# Patient Record
Sex: Male | Born: 1975 | Race: White | Hispanic: No
Health system: Southern US, Community
[De-identification: ages and names within clinical notes are randomized; demographics above are authoritative.]

## PROBLEM LIST (undated history)

## (undated) DIAGNOSIS — J45909 Unspecified asthma, uncomplicated: Secondary | ICD-10-CM

## (undated) DIAGNOSIS — R569 Unspecified convulsions: Secondary | ICD-10-CM

## (undated) DIAGNOSIS — I10 Essential (primary) hypertension: Secondary | ICD-10-CM

## (undated) DIAGNOSIS — F909 Attention-deficit hyperactivity disorder, unspecified type: Secondary | ICD-10-CM

## (undated) HISTORY — DX: Unspecified asthma, uncomplicated: J45.909

## (undated) HISTORY — DX: Unspecified convulsions: R56.9

## (undated) HISTORY — DX: Attention-deficit hyperactivity disorder, unspecified type: F90.9

## (undated) HISTORY — DX: Essential (primary) hypertension: I10

---

## 2019-10-24 DIAGNOSIS — G40409 Other generalized epilepsy and epileptic syndromes, not intractable, without status epilepticus: Secondary | ICD-10-CM | POA: Diagnosis not present

## 2019-10-24 DIAGNOSIS — F901 Attention-deficit hyperactivity disorder, predominantly hyperactive type: Secondary | ICD-10-CM | POA: Diagnosis not present

## 2019-10-24 DIAGNOSIS — M79671 Pain in right foot: Secondary | ICD-10-CM | POA: Diagnosis not present

## 2019-10-24 DIAGNOSIS — I10 Essential (primary) hypertension: Secondary | ICD-10-CM | POA: Diagnosis not present

## 2019-10-31 ENCOUNTER — Ambulatory Visit (INDEPENDENT_AMBULATORY_CARE_PROVIDER_SITE_OTHER): Payer: BC Managed Care – PPO | Admitting: Podiatry

## 2019-10-31 ENCOUNTER — Encounter: Payer: Self-pay | Admitting: Podiatry

## 2019-10-31 ENCOUNTER — Other Ambulatory Visit: Payer: Self-pay

## 2019-10-31 ENCOUNTER — Other Ambulatory Visit: Payer: Self-pay | Admitting: Podiatry

## 2019-10-31 ENCOUNTER — Ambulatory Visit (INDEPENDENT_AMBULATORY_CARE_PROVIDER_SITE_OTHER): Payer: BC Managed Care – PPO

## 2019-10-31 VITALS — Temp 97.3°F

## 2019-10-31 DIAGNOSIS — M19071 Primary osteoarthritis, right ankle and foot: Secondary | ICD-10-CM

## 2019-10-31 DIAGNOSIS — M79671 Pain in right foot: Secondary | ICD-10-CM

## 2019-10-31 DIAGNOSIS — S92061S Displaced intraarticular fracture of right calcaneus, sequela: Secondary | ICD-10-CM

## 2019-10-31 DIAGNOSIS — M25571 Pain in right ankle and joints of right foot: Secondary | ICD-10-CM | POA: Diagnosis not present

## 2019-10-31 DIAGNOSIS — M25471 Effusion, right ankle: Secondary | ICD-10-CM | POA: Diagnosis not present
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
## 2019-11-06 ENCOUNTER — Telehealth: Payer: Self-pay | Admitting: Podiatry

## 2019-11-06 NOTE — Telephone Encounter (Signed)
Pt here on 10-31-19 for rt foot pain, had injection and was doing better. No new injury since visit.  Pt now having constant pain.  Has been using epson salt, heat, aleve, ibuprofen with no relief.  Pt would like a call back to discuss.

## 2019-11-07 ENCOUNTER — Telehealth: Payer: Self-pay | Admitting: *Deleted

## 2019-11-07 MED ORDER — METHYLPREDNISOLONE 4 MG PO TBPK: ORAL_TABLET | ORAL | 0 refills | Status: AC

## 2019-11-07 MED ORDER — TRAMADOL HCL 50 MG PO TABS: ORAL_TABLET | ORAL | 0 refills | Status: AC

## 2019-11-07 NOTE — Addendum Note (Signed)
Addended by: Alphia Kava D on: 11/07/2019 12:10 PM   Modules accepted: Orders

## 2019-11-07 NOTE — Telephone Encounter (Signed)
Left message on pt's phone with Dr. Kandice Hams orders.

## 2019-11-07 NOTE — Telephone Encounter (Addendum)
I spoke with Randall Pierce and alternating heating and ice without relief. Randall Pierce states may be irritation from the boot, but doesn't know what else to do for pt. Pt had to call out of work.

## 2019-11-07 NOTE — Telephone Encounter (Signed)
Dr. Samuella Cota ordered through [employer] medrol dose pack and tramadol 50mg  #6 one tablet every 8 hours. Unable to leave message informing of Dr. [place] orders for medrol dose pack and tramadol.

## 2019-11-07 NOTE — Telephone Encounter (Addendum)
Unable to leave message on Randall Pierce's phone for rx information, line was busy 2 times.

## 2019-11-07 NOTE — Telephone Encounter (Addendum)
Pt's wife, Randall Pierce states pt is in excruciating pain and the heating pad and warm espom salt soaks are not helping and he is having severe pain and irritation at the scar site, and the injection did not help.

## 2019-11-07 NOTE — Telephone Encounter (Signed)
Unable to leave a message on Randall Pierce's phone line is busy.

## 2019-11-07 NOTE — Telephone Encounter (Signed)
left message with Tramadol orders [id number].

## 2019-11-07 NOTE — Telephone Encounter (Signed)
Answered in 11/07/2019 Telephone Call.

## 2019-12-21 DIAGNOSIS — F17219 Nicotine dependence, cigarettes, with unspecified nicotine-induced disorders: Secondary | ICD-10-CM | POA: Diagnosis not present

## 2019-12-21 DIAGNOSIS — R1011 Right upper quadrant pain: Secondary | ICD-10-CM | POA: Diagnosis not present

## 2019-12-21 DIAGNOSIS — F901 Attention-deficit hyperactivity disorder, predominantly hyperactive type: Secondary | ICD-10-CM | POA: Diagnosis not present

## 2019-12-21 DIAGNOSIS — J309 Allergic rhinitis, unspecified: Secondary | ICD-10-CM | POA: Diagnosis not present

## 2020-03-02 ENCOUNTER — Encounter (HOSPITAL_COMMUNITY): Payer: Self-pay

## 2020-03-02 ENCOUNTER — Emergency Department (HOSPITAL_COMMUNITY): Payer: BC Managed Care – PPO

## 2020-03-02 ENCOUNTER — Emergency Department (HOSPITAL_COMMUNITY)
Admission: EM | Admit: 2020-03-02 | Discharge: 2020-03-03 | Disposition: A | Discharge status: Home / Self Care | Payer: BC Managed Care – PPO | Attending: Emergency Medicine | Admitting: Emergency Medicine

## 2020-03-02 DIAGNOSIS — Z20822 Contact with and (suspected) exposure to covid-19: Secondary | ICD-10-CM | POA: Diagnosis not present

## 2020-03-02 DIAGNOSIS — R0981 Nasal congestion: Secondary | ICD-10-CM | POA: Diagnosis not present

## 2020-03-02 DIAGNOSIS — R05 Cough: Secondary | ICD-10-CM | POA: Diagnosis not present

## 2020-03-02 DIAGNOSIS — R079 Chest pain, unspecified: Secondary | ICD-10-CM | POA: Diagnosis not present

## 2020-03-02 DIAGNOSIS — R509 Fever, unspecified: Secondary | ICD-10-CM | POA: Diagnosis not present

## 2020-03-02 DIAGNOSIS — Z5321 Procedure and treatment not carried out due to patient leaving prior to being seen by health care provider: Secondary | ICD-10-CM | POA: Insufficient documentation

## 2020-03-02 DIAGNOSIS — R0602 Shortness of breath: Secondary | ICD-10-CM | POA: Diagnosis not present

## 2020-03-02 LAB — CBC
HCT: 42.1 % (ref 39.0–52.0)
Hemoglobin: 13.6 g/dL (ref 13.0–17.0)
MCH: 31.1 pg (ref 26.0–34.0)
MCHC: 32.3 g/dL (ref 30.0–36.0)
MCV: 96.3 fL (ref 80.0–100.0)
Platelets: 245 10*3/uL (ref 150–400)
RBC: 4.37 MIL/uL (ref 4.22–5.81)
RDW: 13.4 % (ref 11.5–15.5)
WBC: 7.9 10*3/uL (ref 4.0–10.5)
nRBC: 0 % (ref 0.0–0.2)

## 2020-03-02 LAB — TROPONIN I (HIGH SENSITIVITY)
Troponin I (High Sensitivity): 6 ng/L (ref <18)
Troponin I (High Sensitivity): 8 ng/L (ref <18)

## 2020-03-02 LAB — BASIC METABOLIC PANEL
Anion gap: 11 (ref 5–15)
BUN: 9 mg/dL (ref 6–20)
CO2: 21 mmol/L — ABNORMAL LOW (ref 22–32)
Calcium: 8.5 mg/dL — ABNORMAL LOW (ref 8.9–10.3)
Chloride: 103 mmol/L (ref 98–111)
Creatinine, Ser: 1.05 mg/dL (ref 0.61–1.24)
GFR calc Af Amer: >60 mL/min (ref >60)
GFR calc non Af Amer: >60 mL/min (ref >60)
Glucose, Bld: 221 mg/dL — ABNORMAL HIGH (ref 70–99)
Potassium: 3.2 mmol/L — ABNORMAL LOW (ref 3.5–5.1)
Sodium: 135 mmol/L (ref 135–145)

## 2020-03-02 LAB — SARS CORONAVIRUS 2 BY RT PCR (HOSPITAL ORDER, PERFORMED IN ~~LOC~~ HOSPITAL LAB): SARS Coronavirus 2: NEGATIVE

## 2020-03-02 MED ORDER — SODIUM CHLORIDE 0.9% FLUSH: 3.0000 mL | Freq: Once | INTRAVENOUS | Status: DC

## 2020-03-02 NOTE — ED Notes (Signed)
Pt family member came up to this tech and asked how much longer tech told family member the wait time.

## 2020-03-02 NOTE — ED Triage Notes (Signed)
Pt states that he has been having has been having CP and SOB since Tuesday, went for Covid test on Wed that was negative then. Fever, cough, congestion

## 2020-03-03 NOTE — ED Notes (Signed)
Pt has left stickers on tech desk in lobby.

## 2020-12-01 IMAGING — DX DG CHEST 1V
1 series · 1 of 1 positions shown · non-contrast
Comparison: None.

CLINICAL DATA: Shortness of breath and chest pain.

EXAM:
CHEST  1 VIEW

[chest ap]
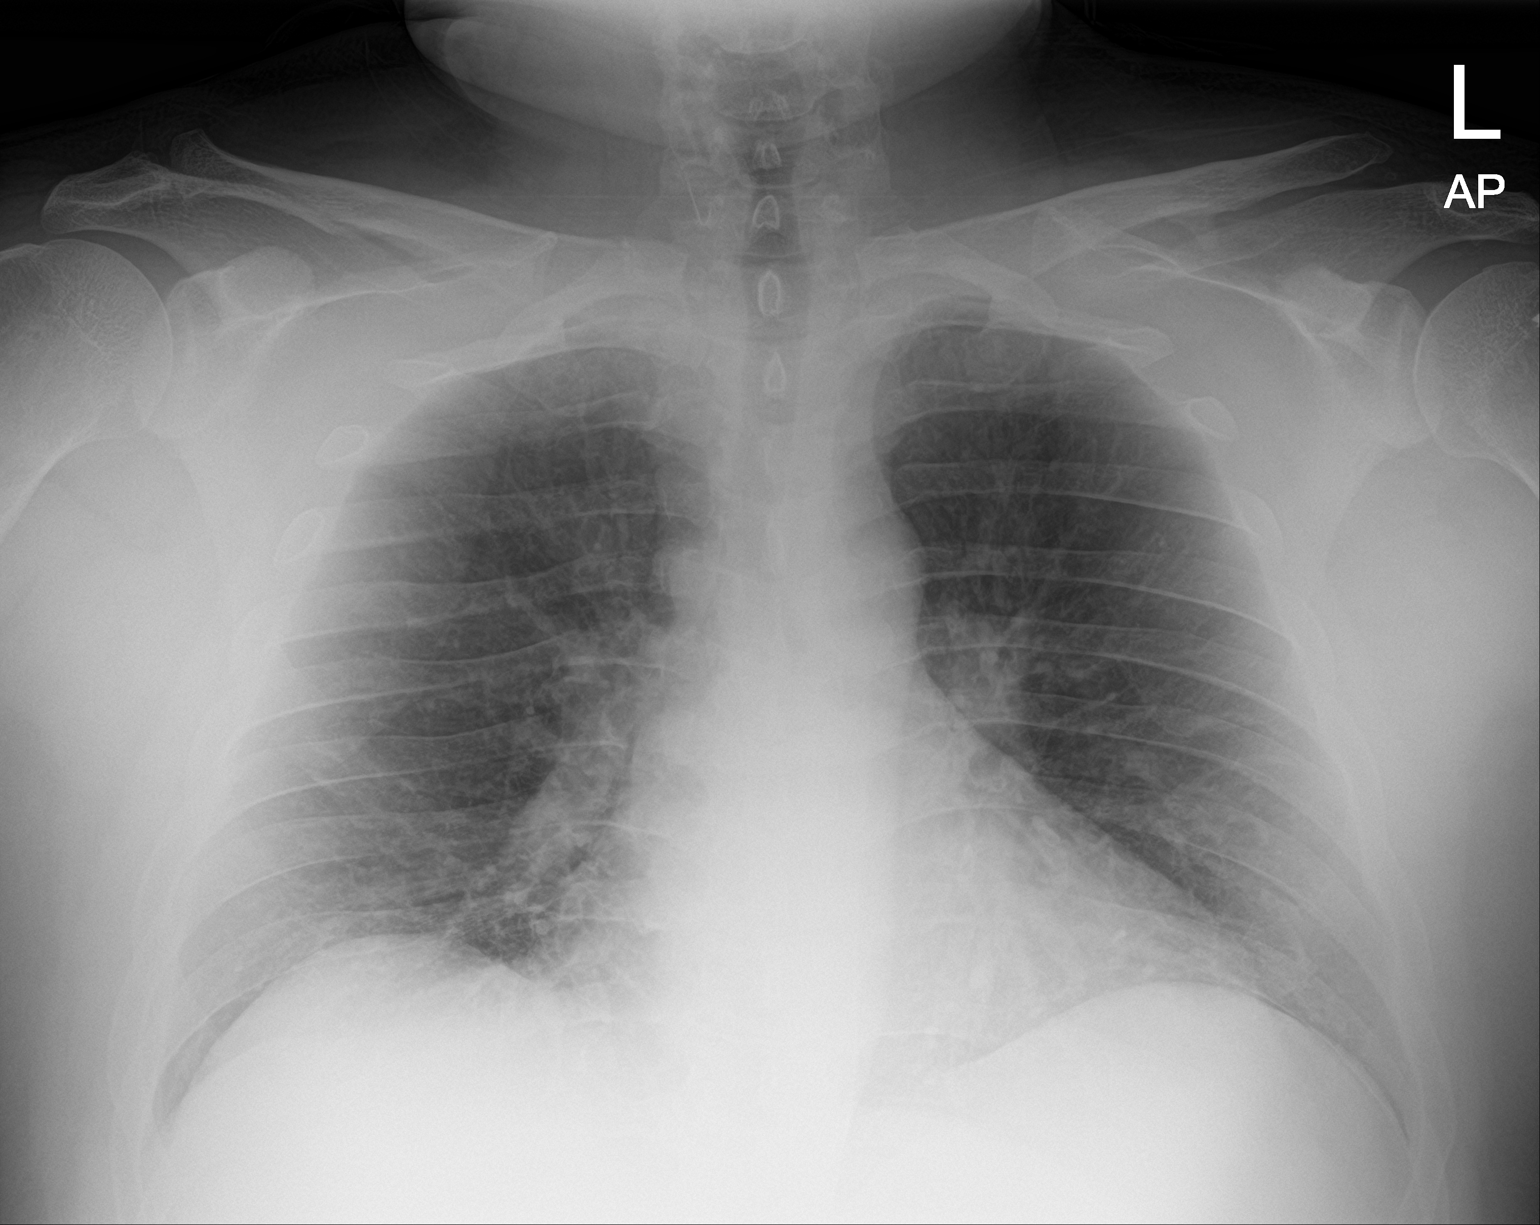

[1 of 1 positions shown; findings below may reference images not displayed]

FINDINGS: Mildly increased bronchovascular lung markings are seen within the
bilateral lung bases. There is no evidence of focal consolidation,
pleural effusion or pneumothorax. The heart size and mediastinal
contours are within normal limits. The visualized skeletal
structures are unremarkable.
IMPRESSION: No active disease.
